# Patient Record
Sex: Male | Born: 1985 | Race: White | Hispanic: No | Marital: Single | Smoking: Current every day smoker
Health system: Southern US, Community
[De-identification: ages and names within clinical notes are randomized; demographics above are authoritative.]

## PROBLEM LIST (undated history)

## (undated) HISTORY — PX: FOOT SURGERY: SHX648

---

## 2008-09-26 ENCOUNTER — Emergency Department: Payer: Self-pay | Admitting: Emergency Medicine

## 2009-12-19 IMAGING — CT CT HEAD WITHOUT CONTRAST
2 series · 15 of 30 positions shown, 19 images · non-contrast
Comparison: none

REASON FOR EXAM: headache, stiff neck, fever, pre LP evaluation
COMMENTS:

[Series 2: without · axial · non-contrast · 0.42mm/px · z∈[-189,-64]mm · 13 of 31 slices shown, 17 images]
[im 3/31  brain]
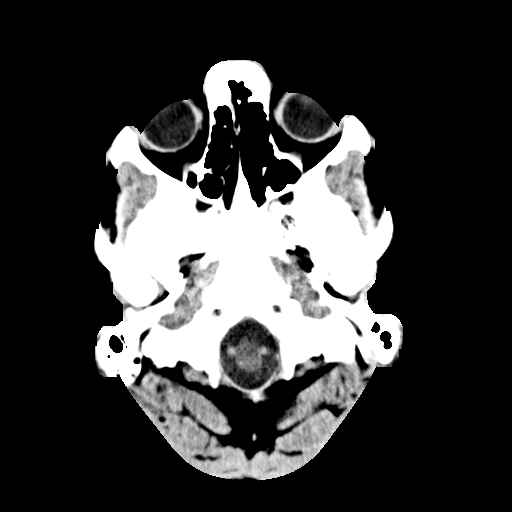
[im 3/31  bone]
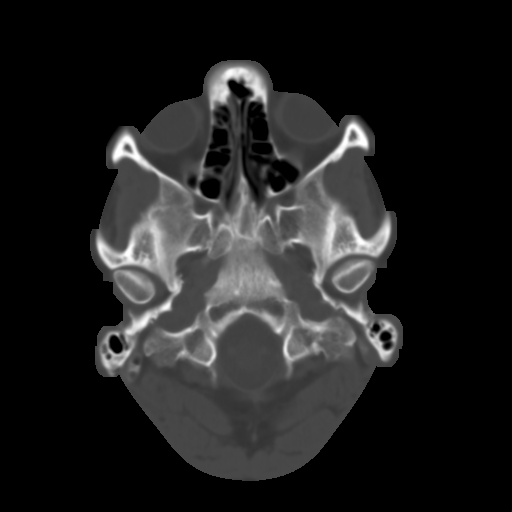
[im 5/31  brain]
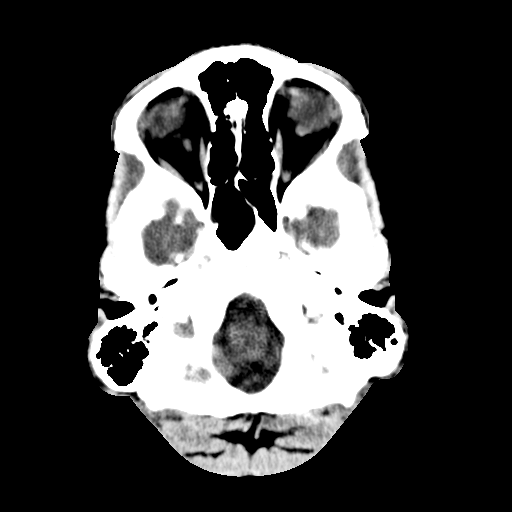
[im 7/31  brain]
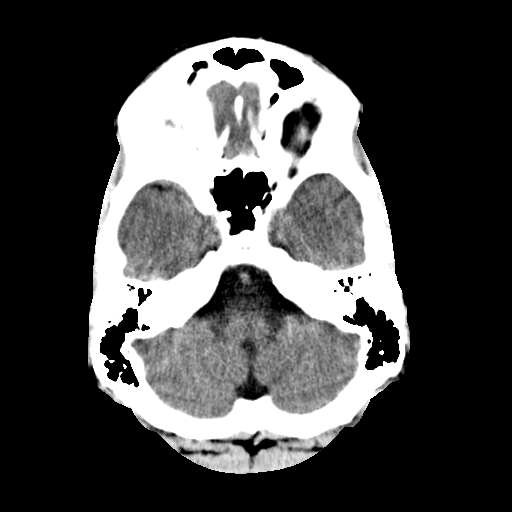
[im 9/31  brain]
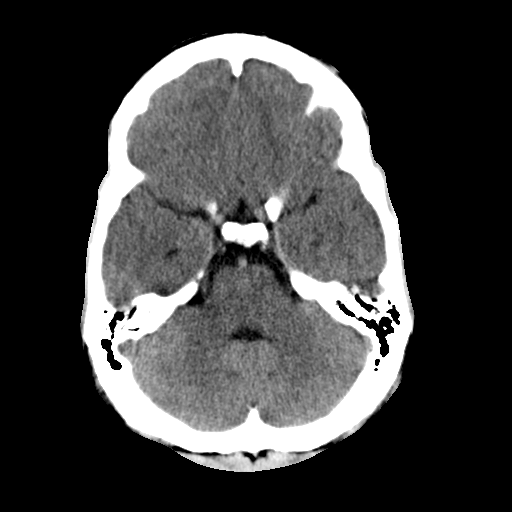
[im 11/31  brain]
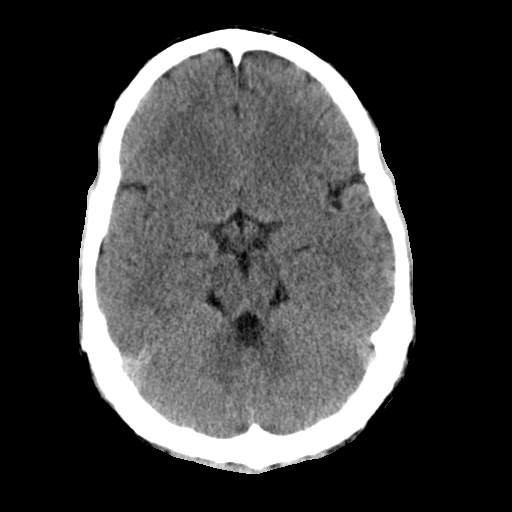
[im 11/31  bone]
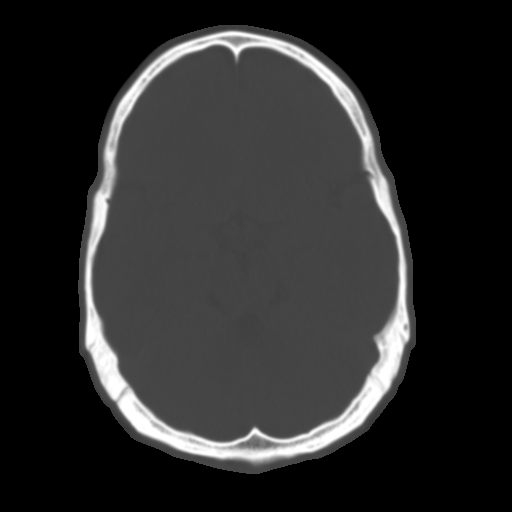
[im 13/31  brain]
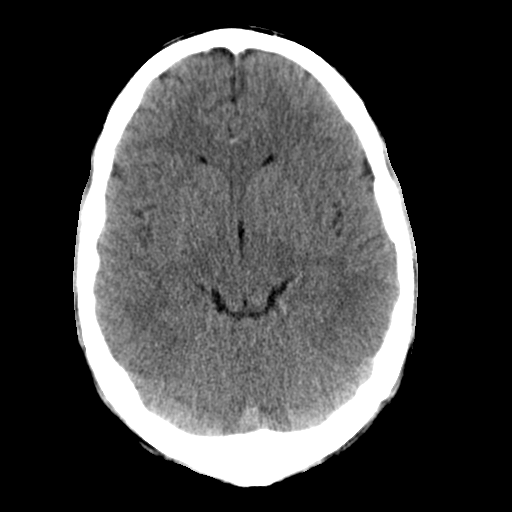
[im 16/31  brain]
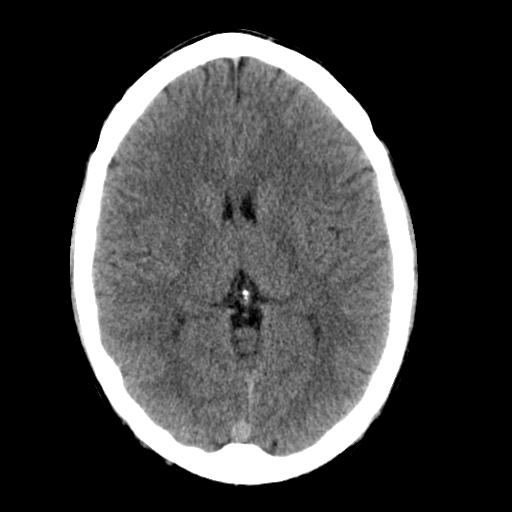
[im 18/31  brain]
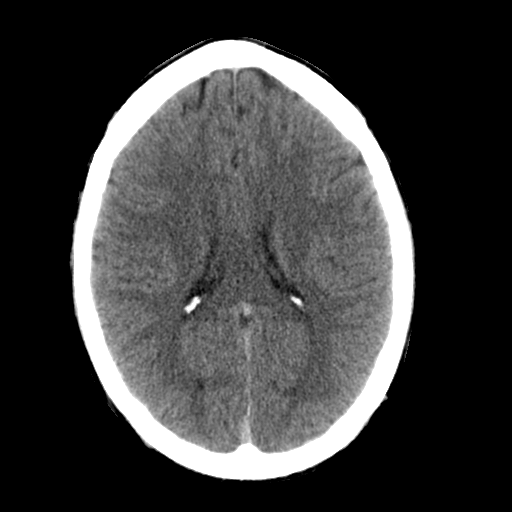
[im 20/31  brain]
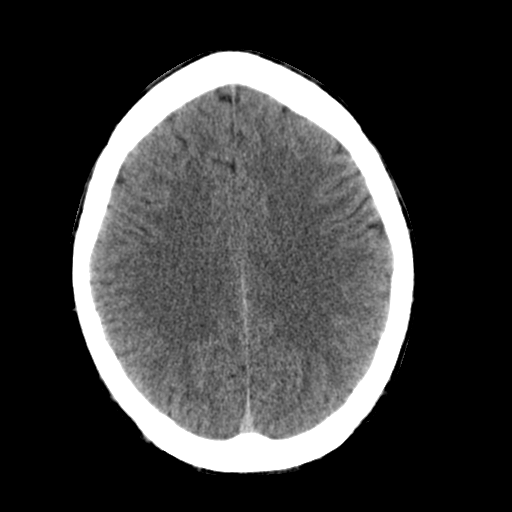
[im 20/31  bone]
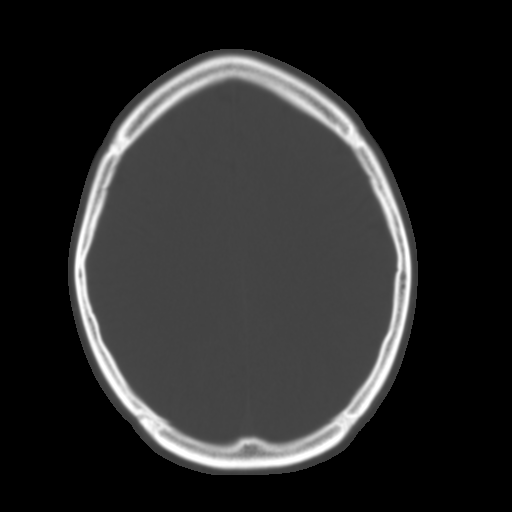
[im 22/31  brain]
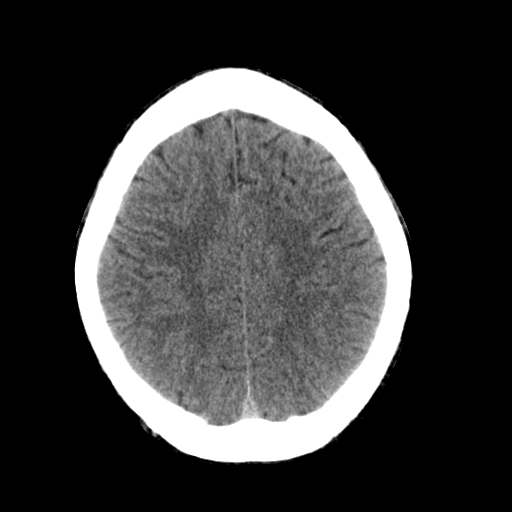
[im 24/31  brain]
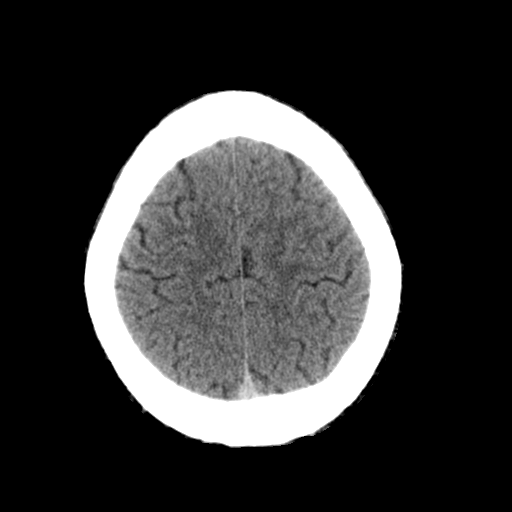
[im 26/31  brain]
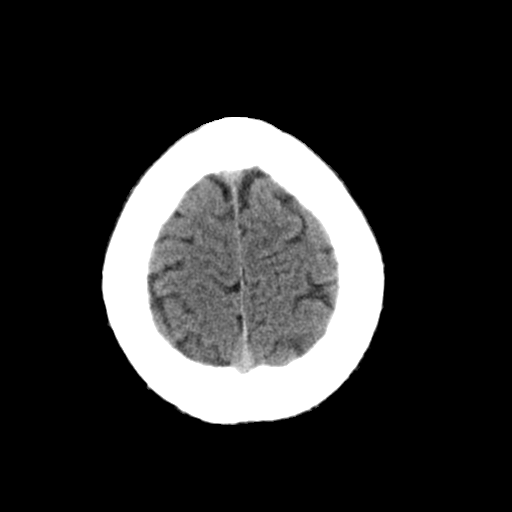
[im 28/31  brain]
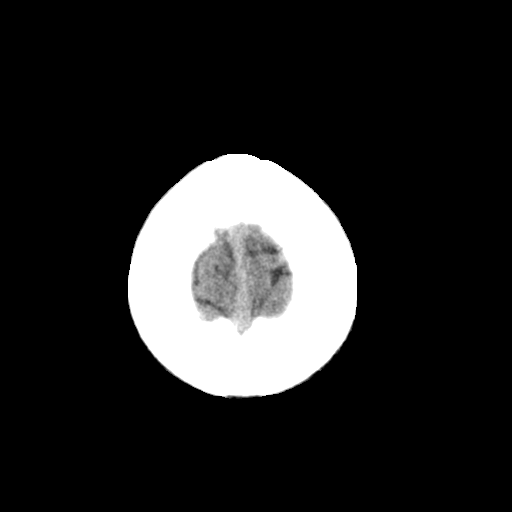
[im 28/31  bone]
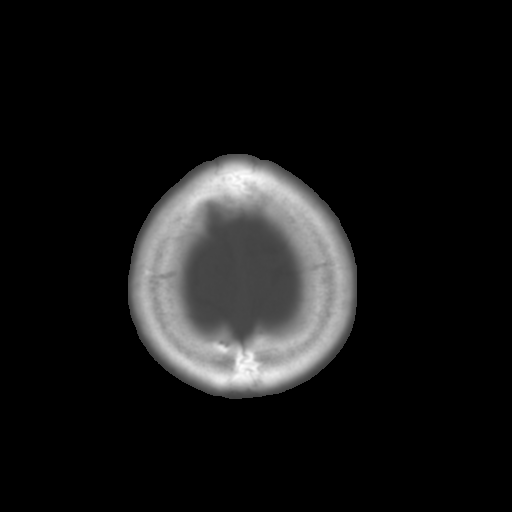

[Series 3: bone · axial · 0.42mm/px · z∈[-189,-169]mm · 2 of 31 slices shown]
[im 3/31  bone]
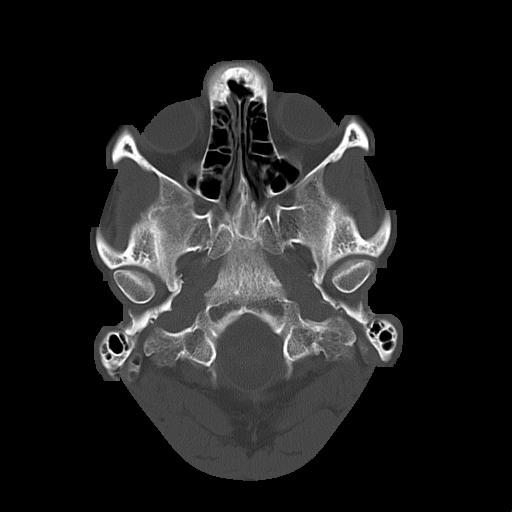
[im 7/31  bone]
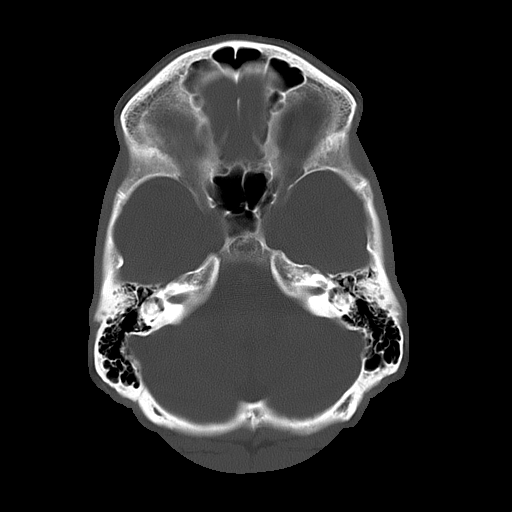

[15 of 30 positions shown; findings below may reference images not displayed]

PROCEDURE:     CT  - CT HEAD WITHOUT CONTRAST  - September 26, 2008  [DATE]

RESULT:     Axial noncontrast CT scanning was performed through the brain at
5 mm intervals and slice thicknesses.

The ventricles are normal in size and position. There is no intracranial
hemorrhage nor intracranial mass effect. The cerebellum and brainstem are
normal in density. At bone window settings there is mucoperiosteal
thickening of the right maxillary sinus as well as within portions of the
sphenoid and ethmoid sinuses. The frontal sinuses and mastoid air cells are
clear. There is no evidence of a skull fracture.
IMPRESSION: 1. I do not see acute abnormality of the brain.
2. There is mucoperiosteal thickening of the right maxillary sinus, the
sphenoid sinuses, and portions of the ethmoid sinuses consistent with sinus
inflammation. I do not see air fluid levels.

## 2013-03-19 ENCOUNTER — Ambulatory Visit: Payer: Self-pay | Admitting: Physician Assistant

## 2014-02-17 ENCOUNTER — Ambulatory Visit: Payer: Self-pay | Admitting: Physician Assistant

## 2014-02-17 LAB — DOT URINE DIP
BLOOD: NEGATIVE
Glucose,UR: NEGATIVE
PROTEIN: NEGATIVE
Specific Gravity: 1.025 (ref 1.000–1.030)

## 2016-08-29 ENCOUNTER — Encounter: Payer: Self-pay | Admitting: Gynecology

## 2016-08-29 ENCOUNTER — Ambulatory Visit
Admission: EM | Admit: 2016-08-29 | Discharge: 2016-08-29 | Disposition: A | Discharge status: Home / Self Care | Payer: Self-pay | Attending: Internal Medicine | Admitting: Internal Medicine

## 2016-08-29 DIAGNOSIS — Z0289 Encounter for other administrative examinations: Secondary | ICD-10-CM

## 2016-08-29 DIAGNOSIS — Z024 Encounter for examination for driving license: Secondary | ICD-10-CM

## 2016-08-29 LAB — DEPT OF TRANSP DIPSTICK, URINE (ARMC ONLY)
GLUCOSE, UA: NEGATIVE mg/dL
Hgb urine dipstick: NEGATIVE
PROTEIN: NEGATIVE mg/dL
Specific Gravity, Urine: 1.015 (ref 1.005–1.030)
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
## 2016-08-29 NOTE — ED Triage Notes (Signed)
DOT EXAM 

## 2023-06-08 ENCOUNTER — Encounter: Payer: Self-pay | Admitting: Internal Medicine
